# Patient Record
Sex: Male | Born: 1980 | Race: White | Hispanic: No | Marital: Single | Smoking: Current every day smoker
Health system: Southern US, Community
[De-identification: ages and names within clinical notes are randomized; demographics above are authoritative.]

---

## 2008-06-17 ENCOUNTER — Emergency Department: Payer: Self-pay | Admitting: Unknown Physician Specialty

## 2010-08-25 ENCOUNTER — Emergency Department: Payer: Self-pay | Admitting: Internal Medicine

## 2010-08-27 ENCOUNTER — Emergency Department: Payer: Self-pay | Admitting: Emergency Medicine

## 2016-01-28 ENCOUNTER — Emergency Department: Payer: Self-pay

## 2016-01-28 ENCOUNTER — Emergency Department
Admission: EM | Admit: 2016-01-28 | Discharge: 2016-01-29 | Disposition: A | Discharge status: Home / Self Care | Payer: Self-pay | Attending: Emergency Medicine | Admitting: Emergency Medicine

## 2016-01-28 DIAGNOSIS — R112 Nausea with vomiting, unspecified: Secondary | ICD-10-CM | POA: Insufficient documentation

## 2016-01-28 DIAGNOSIS — F17210 Nicotine dependence, cigarettes, uncomplicated: Secondary | ICD-10-CM | POA: Insufficient documentation

## 2016-01-28 DIAGNOSIS — R1084 Generalized abdominal pain: Secondary | ICD-10-CM | POA: Insufficient documentation

## 2016-01-28 LAB — COMPREHENSIVE METABOLIC PANEL
ALBUMIN: 6 g/dL — AB (ref 3.5–5.0)
ALK PHOS: 72 U/L (ref 38–126)
ALT: 51 U/L (ref 17–63)
AST: 49 U/L — AB (ref 15–41)
Anion gap: 17 — ABNORMAL HIGH (ref 5–15)
BILIRUBIN TOTAL: 1 mg/dL (ref 0.3–1.2)
BUN: 22 mg/dL — AB (ref 6–20)
CALCIUM: 10.6 mg/dL — AB (ref 8.9–10.3)
CO2: 19 mmol/L — ABNORMAL LOW (ref 22–32)
Chloride: 100 mmol/L — ABNORMAL LOW (ref 101–111)
Creatinine, Ser: 1.47 mg/dL — ABNORMAL HIGH (ref 0.61–1.24)
GFR calc Af Amer: >60 mL/min (ref >60)
GFR calc non Af Amer: >60 mL/min (ref >60)
GLUCOSE: 150 mg/dL — AB (ref 65–99)
POTASSIUM: 4.5 mmol/L (ref 3.5–5.1)
Sodium: 136 mmol/L (ref 135–145)
TOTAL PROTEIN: 9.4 g/dL — AB (ref 6.5–8.1)

## 2016-01-28 LAB — URINALYSIS COMPLETE WITH MICROSCOPIC (ARMC ONLY)
BACTERIA UA: NONE SEEN
Glucose, UA: NEGATIVE mg/dL
LEUKOCYTES UA: NEGATIVE
Nitrite: NEGATIVE
PH: 5 (ref 5.0–8.0)
Protein, ur: 100 mg/dL — AB
Specific Gravity, Urine: 1.04 — ABNORMAL HIGH (ref 1.005–1.030)

## 2016-01-28 LAB — CBC WITH DIFFERENTIAL/PLATELET
BASOS ABS: 0.1 10*3/uL (ref 0–0.1)
BASOS PCT: 0 %
Eosinophils Absolute: 0 10*3/uL (ref 0–0.7)
Eosinophils Relative: 0 %
HEMATOCRIT: 47.7 % (ref 40.0–52.0)
HEMOGLOBIN: 16.6 g/dL (ref 13.0–18.0)
LYMPHS ABS: 2.3 10*3/uL (ref 1.0–3.6)
LYMPHS PCT: 11 %
MCH: 30 pg (ref 26.0–34.0)
MCHC: 34.9 g/dL (ref 32.0–36.0)
MCV: 86.1 fL (ref 80.0–100.0)
MONOS PCT: 5 %
Monocytes Absolute: 1.1 10*3/uL — ABNORMAL HIGH (ref 0.2–1.0)
Neutro Abs: 17.7 10*3/uL — ABNORMAL HIGH (ref 1.4–6.5)
Neutrophils Relative %: 84 %
Platelets: 261 10*3/uL (ref 150–440)
RBC: 5.54 MIL/uL (ref 4.40–5.90)
RDW: 13.1 % (ref 11.5–14.5)
WBC: 21.2 10*3/uL — ABNORMAL HIGH (ref 3.8–10.6)

## 2016-01-28 LAB — LIPASE, BLOOD: Lipase: 16 U/L (ref 11–51)

## 2016-01-28 MED ORDER — IOPAMIDOL (ISOVUE-300) INJECTION 61%
100.0000 mL | Freq: Once | INTRAVENOUS | Status: AC | PRN
  Administered 2016-01-28: 100 mL via INTRAVENOUS

## 2016-01-28 MED ORDER — SODIUM CHLORIDE 0.9 % IV BOLUS (SEPSIS)
2000.0000 mL | Freq: Once | INTRAVENOUS | Status: AC
  Administered 2016-01-28: 1000 mL via INTRAVENOUS

## 2016-01-28 MED ORDER — ONDANSETRON HCL 4 MG/2ML IJ SOLN
4.0000 mg | Freq: Once | INTRAMUSCULAR | Status: AC
  Administered 2016-01-28: 4 mg via INTRAVENOUS
  Filled 2016-01-28: qty 2

## 2016-01-28 MED ORDER — MORPHINE SULFATE (PF) 4 MG/ML IV SOLN
4.0000 mg | Freq: Once | INTRAVENOUS | Status: AC
  Administered 2016-01-28: 4 mg via INTRAVENOUS
  Filled 2016-01-28: qty 1

## 2016-01-28 MED ORDER — DIATRIZOATE MEGLUMINE & SODIUM 66-10 % PO SOLN
15.0000 mL | Freq: Once | ORAL | Status: AC
  Administered 2016-01-28: 15 mL via ORAL

## 2016-01-28 MED ORDER — ONDANSETRON HCL 4 MG/2ML IJ SOLN
4.0000 mg | Freq: Once | INTRAMUSCULAR | Status: AC
  Administered 2016-01-29: 4 mg via INTRAVENOUS
  Filled 2016-01-28: qty 2

## 2016-01-28 MED ORDER — MORPHINE SULFATE (PF) 4 MG/ML IV SOLN
4.0000 mg | Freq: Once | INTRAVENOUS | Status: AC
  Administered 2016-01-29: 4 mg via INTRAVENOUS
  Filled 2016-01-28: qty 1

## 2016-01-28 MED ORDER — FAMOTIDINE 20 MG PO TABS: 20.0000 mg | ORAL_TABLET | Freq: Two times a day (BID) | ORAL | 1 refills | Status: AC

## 2016-01-28 NOTE — ED Provider Notes (Addendum)
[facility] Emergency Department Provider Note  ____________________________________________  Time seen: Approximately 10:07 PM  I have reviewed the triage vital signs and the nursing notes.   HISTORY  Chief Complaint Emesis   HPI Walter Soto is a 35 y.o. male who presents for evaluation of abdominal pain, nausea and vomiting. Patient reports that for the last 7 years he has a yearly episode of epigastric discomfort associated with multiple episodes of vomiting. He has never seen a doctor or a GI specialist for any of these episodes. He reports that today at 6:45 PM he started having vomiting. He has been vomiting once every 15 minutes. Initially what ever he had eaten and now has some specks of blood in it. He also reports that he has been having severe diffuse and constant abdominal pain radiating to his bilateral flanks. He reports that he has never had pain this intense before. He hasn't tried anything at home for the pain. He denies chest pain or shortness of breath, fever, chills, diarrhea, dysuria, hematuria. He is a smoker. No family history of ischemic heart disease.  History reviewed. No pertinent past medical history.  There are no active problems to display for this patient.   No past surgical history on file.  Prior to Admission medications   Not on File    Allergies Review of patient's allergies indicates no known allergies.  No family history on file.  Social History Social History  Substance Use Topics  . Smoking status: Not on file  . Smokeless tobacco: Not on file  . Alcohol use Not on file    Review of Systems  Constitutional: Negative for fever. Eyes: Negative for visual changes. ENT: Negative for sore throat. Cardiovascular: Negative for chest pain. Respiratory: Negative for shortness of breath. Gastrointestinal: + diffuse abdominal pain, vomiting. No diarrhea. Genitourinary: Negative for dysuria. Musculoskeletal:  Negative for back pain. Skin: Negative for rash. Neurological: Negative for headaches, weakness or numbness.  ____________________________________________   PHYSICAL EXAM:  VITAL SIGNS: ED Triage Vitals  Enc Vitals Group     BP 01/28/16 2140 135/82     Pulse Rate 01/28/16 2140 71     Resp --      Temp --      Temp src --      SpO2 01/28/16 2140 97 %     Weight 01/28/16 2141 192 lb (87.1 kg)     Height 01/28/16 2141 5\' 10"  (1.778 m)     Head Circumference --      Peak Flow --      Pain Score 01/28/16 2151 9     Pain Loc --      Pain Edu? --      Excl. in GC? --     Constitutional: Alert and oriented, in moderate distress due to pain. HEENT:      Head: Normocephalic and atraumatic.         Eyes: Conjunctivae are normal. Sclera is non-icteric. EOMI. PERRL      Mouth/Throat: Mucous membranes are dry.       Neck: Supple with no signs of meningismus. Cardiovascular: Regular rate and rhythm. No murmurs, gallops, or rubs. 2+ symmetrical distal pulses are present in all extremities. No JVD. Respiratory: Normal respiratory effort. Lungs are clear to auscultation bilaterally. No wheezes, crackles, or rhonchi.  Gastrointestinal: Soft, diffusely tender to palpation in all quadrants other than RLQ with guarding. No rebound Genitourinary: bilateral CVA tenderness. Musculoskeletal: Nontender with normal range of motion in  all extremities. No edema, cyanosis, or erythema of extremities. Neurologic: Normal speech and language. Face is symmetric. Moving all extremities. No gross focal neurologic deficits are appreciated. Skin: Skin is warm, dry and intact. No rash noted. Psychiatric: Mood and affect are normal. Speech and behavior are normal.  ____________________________________________   LABS (all labs ordered are listed, but only abnormal results are displayed)  Labs Reviewed  CBC WITH DIFFERENTIAL/PLATELET - Abnormal; Notable for the following:       Result Value   WBC 21.2 (*)     Neutro Abs 17.7 (*)    Monocytes Absolute 1.1 (*)    All other components within normal limits  COMPREHENSIVE METABOLIC PANEL - Abnormal; Notable for the following:    Chloride 100 (*)    CO2 19 (*)    Glucose, Bld 150 (*)    BUN 22 (*)    Creatinine, Ser 1.47 (*)    Calcium 10.6 (*)    Total Protein 9.4 (*)    Albumin 6.0 (*)    AST 49 (*)    Anion gap 17 (*)    All other components within normal limits  URINALYSIS COMPLETEWITH MICROSCOPIC ([facility] ONLY) - Abnormal; Notable for the following:    Color, Urine AMBER (*)    APPearance HAZY (*)    Bilirubin Urine 1+ (*)    Ketones, ur 2+ (*)    Specific Gravity, Urine 1.040 (*)    Hgb urine dipstick 1+ (*)    Protein, ur 100 (*)    Squamous Epithelial / LPF 0-5 (*)    All other components within normal limits  URINE CULTURE  LIPASE, BLOOD   ____________________________________________  EKG  ED ECG REPORT I, Nita Sicklearolina Iver Miklas, the attending physician, personally viewed and interpreted this ECG.  NSR, rate 55, normal intervals, normal axis, BER, no ST depressions. No prior for comparison. ____________________________________________  RADIOLOGY  CT a/p: Negative ____________________________________________   PROCEDURES  Procedure(s) performed: None Procedures Critical Care performed:  None ____________________________________________   INITIAL IMPRESSION / ASSESSMENT AND PLAN / ED COURSE  35 y.o. male who presents for evaluation of severe diffuse abdominal pain associated with nausea and multiple episodes of vomiting. Patient is in moderate distress, his vital signs are within normal limits, his abdomen is diffusely tender with guarding, he also has bilateral CVA tenderness. Patient actively vomiting. Will give IVF, IV zofran, IV pepcid, and IV morphine. Will get labs and CT scan as patient has diffuse ttp with guarding.  Clinical Course   _________________________ 11:33 PM on  01/28/2016 -----------------------------------------  Labs showing WBC of 21.1. CMP/lipase/urine negative. CT negative. Pain improved however still complaining of pain. No longer vomiting but still nauseous. Will give second round of IV morphine and zofran and re-assess for discharge. Care transferred to Dr. Dolores FrameSung  Pertinent labs & imaging results that were available during my care of the patient were reviewed by me and considered in my medical decision making (see chart for details).    ____________________________________________   FINAL CLINICAL IMPRESSION(S) / ED DIAGNOSES  Final diagnoses:  Generalized abdominal pain  Non-intractable vomiting with nausea, vomiting of unspecified type      NEW MEDICATIONS STARTED DURING THIS VISIT:  New Prescriptions   No medications on file     Note:  This document was prepared using Dragon voice recognition software and may include unintentional dictation errors.    Nita Sicklearolina Taylore Hinde, MD 01/28/16 2357    Nita Sicklearolina Huzaifa Viney, MD 01/29/16 1022

## 2016-01-28 NOTE — ED Triage Notes (Signed)
Pt in with acute onset of epigastric pain and vomiting, also co bilat flank pain. Pt has had n.v.d. And decreased urination.

## 2016-01-29 MED ORDER — PROMETHAZINE HCL 25 MG PO TABS
25.0000 mg | ORAL_TABLET | Freq: Once | ORAL | Status: AC
  Administered 2016-01-29: 25 mg via ORAL
  Filled 2016-01-29: qty 1

## 2016-01-29 MED ORDER — ONDANSETRON HCL 4 MG PO TABS: 4.0000 mg | ORAL_TABLET | Freq: Three times a day (TID) | ORAL | 1 refills | Status: AC | PRN

## 2016-01-29 NOTE — Discharge Instructions (Addendum)

## 2016-01-29 NOTE — ED Provider Notes (Signed)
-----------------------------------------   12:20 AM on 01/29/2016 -----------------------------------------  ED ECG REPORT I, Sabriel Borromeo J, the attending physician, personally viewed and interpreted this ECG.   Date: 01/29/2016  EKG Time: 0016  Rate: 55  Rhythm: sinus bradycardia  Axis: Normal  Intervals:none  ST&T Change: BER  ----------------------------------------- 1:16 AM on 01/29/2016 -----------------------------------------  Pain gone. Nausea much improved. Patient asking for additional dose of antiemetic to get him through the night. Phenergan PO ordered. Patient will be discharged home with prescriptions for Pepcid and Zofran per Dr. Don PerkingVeronese. Strict return precautions given. Patient and family member verbalize understanding and agree with plan of care.   Irean HongJade J Laine Fonner, MD 01/29/16 [phone]

## 2016-01-30 ENCOUNTER — Emergency Department: Payer: Self-pay

## 2016-01-30 ENCOUNTER — Emergency Department
Admission: EM | Admit: 2016-01-30 | Discharge: 2016-01-30 | Disposition: A | Discharge status: Home / Self Care | Payer: Self-pay | Attending: Emergency Medicine | Admitting: Emergency Medicine

## 2016-01-30 DIAGNOSIS — K219 Gastro-esophageal reflux disease without esophagitis: Secondary | ICD-10-CM | POA: Insufficient documentation

## 2016-01-30 DIAGNOSIS — F17210 Nicotine dependence, cigarettes, uncomplicated: Secondary | ICD-10-CM | POA: Insufficient documentation

## 2016-01-30 DIAGNOSIS — K2970 Gastritis, unspecified, without bleeding: Secondary | ICD-10-CM | POA: Insufficient documentation

## 2016-01-30 LAB — COMPREHENSIVE METABOLIC PANEL
ALK PHOS: 61 U/L (ref 38–126)
ALT: 44 U/L (ref 17–63)
AST: 61 U/L — AB (ref 15–41)
Albumin: 5.2 g/dL — ABNORMAL HIGH (ref 3.5–5.0)
Anion gap: 11 (ref 5–15)
BUN: 18 mg/dL (ref 6–20)
CALCIUM: 9.6 mg/dL (ref 8.9–10.3)
CO2: 24 mmol/L (ref 22–32)
CREATININE: 1.08 mg/dL (ref 0.61–1.24)
Chloride: 101 mmol/L (ref 101–111)
Glucose, Bld: 133 mg/dL — ABNORMAL HIGH (ref 65–99)
Potassium: 3.7 mmol/L (ref 3.5–5.1)
Sodium: 136 mmol/L (ref 135–145)
Total Bilirubin: 0.9 mg/dL (ref 0.3–1.2)
Total Protein: 8.4 g/dL — ABNORMAL HIGH (ref 6.5–8.1)

## 2016-01-30 LAB — URINALYSIS COMPLETE WITH MICROSCOPIC (ARMC ONLY)
BILIRUBIN URINE: NEGATIVE
Bacteria, UA: NONE SEEN
Glucose, UA: NEGATIVE mg/dL
Hgb urine dipstick: NEGATIVE
LEUKOCYTES UA: NEGATIVE
Nitrite: NEGATIVE
PROTEIN: 100 mg/dL — AB
SPECIFIC GRAVITY, URINE: 1.031 — AB (ref 1.005–1.030)
Squamous Epithelial / LPF: NONE SEEN
pH: 5 (ref 5.0–8.0)

## 2016-01-30 LAB — CBC
HCT: 46.6 % (ref 40.0–52.0)
Hemoglobin: 16.2 g/dL (ref 13.0–18.0)
MCH: 30.3 pg (ref 26.0–34.0)
MCHC: 34.8 g/dL (ref 32.0–36.0)
MCV: 87 fL (ref 80.0–100.0)
PLATELETS: 209 10*3/uL (ref 150–440)
RBC: 5.35 MIL/uL (ref 4.40–5.90)
RDW: 13.1 % (ref 11.5–14.5)
WBC: 12.7 10*3/uL — AB (ref 3.8–10.6)

## 2016-01-30 LAB — URINE CULTURE: Culture: NO GROWTH

## 2016-01-30 LAB — FIBRIN DERIVATIVES D-DIMER (ARMC ONLY): Fibrin derivatives D-dimer (ARMC): 344 (ref 0–499)

## 2016-01-30 LAB — TROPONIN I: Troponin I: <0.03 ng/mL (ref <0.03)

## 2016-01-30 LAB — LIPASE, BLOOD: Lipase: 20 U/L (ref 11–51)

## 2016-01-30 MED ORDER — ONDANSETRON HCL 4 MG/2ML IJ SOLN
4.0000 mg | Freq: Once | INTRAMUSCULAR | Status: AC | PRN
  Administered 2016-01-30: 4 mg via INTRAVENOUS
  Filled 2016-01-30: qty 2

## 2016-01-30 MED ORDER — GI COCKTAIL ~~LOC~~
30.0000 mL | Freq: Once | ORAL | Status: AC
  Administered 2016-01-30: 30 mL via ORAL
  Filled 2016-01-30: qty 30

## 2016-01-30 MED ORDER — FAMOTIDINE IN NACL 20-0.9 MG/50ML-% IV SOLN
20.0000 mg | Freq: Two times a day (BID) | INTRAVENOUS | Status: DC
  Administered 2016-01-30: 20 mg via INTRAVENOUS
  Filled 2016-01-30: qty 50

## 2016-01-30 MED ORDER — HYDROMORPHONE HCL 1 MG/ML IJ SOLN
1.0000 mg | Freq: Once | INTRAMUSCULAR | Status: AC
  Administered 2016-01-30: 1 mg via INTRAVENOUS
  Filled 2016-01-30: qty 1

## 2016-01-30 MED ORDER — SODIUM CHLORIDE 0.9 % IV BOLUS (SEPSIS)
1000.0000 mL | Freq: Once | INTRAVENOUS | Status: AC
  Administered 2016-01-30: 1000 mL via INTRAVENOUS

## 2016-01-30 NOTE — ED Provider Notes (Signed)
[facility] Emergency Department Provider Note   ____________________________________________   First MD Initiated Contact with Patient 01/30/16 0700     (approximate)  I have reviewed the triage vital signs and the nursing notes.   HISTORY  Chief Complaint Emesis and Abdominal Pain    HPI Walter Soto is a 35 y.o. male with a history of gastritis who is presenting to the emergency department with epigastric abdominal pain and vomiting. He said the pain was sudden onset at 4:30 this morning and feels like a burning cramping pain. He says it goes up into his chest. He says he has vomited once already this morning. Denies any blood in his vomit or green vomit this morning. Does not report any diarrhea. Says that he has been taking Nexium at home without relief. Says that he has had morphine in the past as well as Dilaudid for his pain control.Patient denies any drinking or drug use. He says that the pain is worse when he is lying down. Says the pain was 7-8 yesterday but today is more of a 9-10.    History reviewed. No pertinent past medical history.  There are no active problems to display for this patient.   History reviewed. No pertinent surgical history.  Prior to Admission medications   Medication Sig Start Date End Date Taking? Authorizing Provider  Aspirin-Acetaminophen-Caffeine (GOODY HEADACHE PO) Take 1 packet by mouth daily as needed. For pain and headache.   Yes Historical Provider, MD  esomeprazole (NEXIUM) 20 MG capsule Take 20 mg by mouth daily at 12 noon.   Yes Historical Provider, MD  famotidine (PEPCID) 20 MG tablet Take 1 tablet (20 mg total) by mouth 2 (two) times daily. 01/28/16 01/27/17 Yes Nita Sickle, MD  ondansetron (ZOFRAN) 4 MG tablet Take 1 tablet (4 mg total) by mouth every 8 (eight) hours as needed for nausea or vomiting. 01/29/16 01/28/17 Yes Nita Sickle, MD    Allergies Penicillins  No family history on  file.  Social History Social History  Substance Use Topics  . Smoking status: Current Every Day Smoker    Packs/day: 0.50    Types: Cigarettes  . Smokeless tobacco: Never Used  . Alcohol use No    Review of Systems Constitutional: No fever/chills Eyes: No visual changes. ENT: No sore throat. Cardiovascular: As above Respiratory: Denies shortness of breath. Gastrointestinal:   No diarrhea.  No constipation. Genitourinary: Negative for dysuria. Musculoskeletal: Negative for back pain. Skin: Negative for rash. Neurological: Negative for headaches, focal weakness or numbness.  10-point ROS otherwise negative.  ____________________________________________   PHYSICAL EXAM:  VITAL SIGNS: ED Triage Vitals  Enc Vitals Group     BP 01/30/16 0700 (!) 144/101     Pulse Rate 01/30/16 0700 64     Resp 01/30/16 0700 (!) 23     Temp 01/30/16 0700 97.7 F (36.5 C)     Temp Source 01/30/16 0700 Oral     SpO2 01/30/16 0700 97 %     Weight 01/30/16 0702 192 lb (87.1 kg)     Height 01/30/16 0702 5\' 10"  (1.778 m)     Head Circumference --      Peak Flow --      Pain Score 01/30/16 0702 6     Pain Loc --      Pain Edu? --      Excl. in GC? --    Constitutional: Alert and oriented. Appears uncomfortable. Eyes: Conjunctivae are normal. PERRL. EOMI. Head: Atraumatic.  Nose: No congestion/rhinnorhea. Mouth/Throat: Mucous membranes are moist.  Neck: No stridor.   Cardiovascular: Normal rate, regular rhythm. Grossly normal heart sounds.   Respiratory: Normal respiratory effort.  No retractions. Lungs CTAB. Gastrointestinal: Soft with mild-to-moderate epigastric tenderness. Murphy sign.  No distention. No CVA tenderness. Musculoskeletal: No lower extremity tenderness nor edema.  No joint effusions. Neurologic:  Normal speech and language. No gross focal neurologic deficits are appreciated.  Skin:  Skin is warm, dry and intact. No rash noted. Psychiatric: Mood and affect are normal.  Speech and behavior are normal.  ____________________________________________   LABS (all labs ordered are listed, but only abnormal results are displayed)  Labs Reviewed  COMPREHENSIVE METABOLIC PANEL - Abnormal; Notable for the following:       Result Value   Glucose, Bld 133 (*)    Total Protein 8.4 (*)    Albumin 5.2 (*)    AST 61 (*)    All other components within normal limits  CBC - Abnormal; Notable for the following:    WBC 12.7 (*)    All other components within normal limits  URINALYSIS COMPLETEWITH MICROSCOPIC ([facility] ONLY) - Abnormal; Notable for the following:    Color, Urine AMBER (*)    APPearance CLEAR (*)    Ketones, ur TRACE (*)    Specific Gravity, Urine 1.031 (*)    Protein, ur 100 (*)    All other components within normal limits  LIPASE, BLOOD  FIBRIN DERIVATIVES D-DIMER ([facility] ONLY)  TROPONIN I   ____________________________________________  EKG  ED ECG REPORT I, Arelia Longest, the attending physician, personally viewed and interpreted this ECG.   Date: 01/30/2016  EKG Time: 835  Rate: 56  Rhythm: normal sinus rhythm  Axis: Normal  Intervals:none  ST&T Change: Diffuse ST elevation consistent with benign early repolarization. No abnormal T-wave inversion.  ____________________________________________  RADIOLOGY  DG Chest 1 View (Accession [id number]) (Order [id number])  Imaging  Date: 01/30/2016 Department: [facility] EMERGENCY DEPARTMENT Released By/Authorizing: Myrna Blazer, MD (auto-released)  PACS Images   Show images for DG Chest 1 View  Study Result   CLINICAL DATA:  Nausea and vomiting with abdomen pain for several days.  EXAM: CHEST 1 VIEW  COMPARISON:  August 27, 2010  FINDINGS: The heart size and mediastinal contours are within normal limits. There is no focal infiltrate, pulmonary edema, or pleural effusion. The visualized skeletal structures are unremarkable.  IMPRESSION: No  active cardiopulmonary disease.   Electronically Signed   By: Sherian Rein M.D.   On: 01/30/2016 08:52     ____________________________________________   PROCEDURES  Procedure(s) performed:   Procedures  Critical Care performed:   ____________________________________________   INITIAL IMPRESSION / ASSESSMENT AND PLAN / ED COURSE  Pertinent labs & imaging results that were available during my care of the patient were reviewed by me and considered in my medical decision making (see chart for details).  Patient with recent visit with CAT scan that was negative for any acute pathology.  Clinical Course    ----------------------------------------- 11:06 AM on 01/30/2016 -----------------------------------------  Patient with minimal relief after GI cocktail. After 1 mg of Dilaudid the patient says he "feels awesome."  He has no abdominal tenderness at this time. Very reassuring lab workup with a decreasing white blood cell count over the past several days. Reassuring x-ray without any acute pathology. Patient will continue with his Prilosec. I told him that I would like to add sucralfate but he said that he  had increased nausea and vomiting with this several years ago. Because of this and told him that he may take Maalox over-the-counter for breakthrough pain. He will also be following up with gastroenterology. ____________________________________________   FINAL CLINICAL IMPRESSION(S) / ED DIAGNOSES  Gastritis. GERD.    NEW MEDICATIONS STARTED DURING THIS VISIT:  New Prescriptions   No medications on file     Note:  This document was prepared using Dragon voice recognition software and may include unintentional dictation errors.    Myrna Blazeravid Matthew Schaevitz, MD 01/30/16 [id number]

## 2016-01-30 NOTE — ED Notes (Signed)
Pt states he felt the burning sensation disappear after receiving the GI cocktail, but states it hurts to breathe.  Informed Dr.Schaevitz of patient's response to med.  Also noticed patient has periods of bradycardia in the low 50's. Captured a second EKG to show MD.  No new orders at this time.  Pt uncomfortable sitting in any position at this time. CXR results are pending at this time. Pt has had minimal dry heaving since receiving zofran earlier.

## 2016-01-30 NOTE — ED Triage Notes (Signed)
Per EMS: Pt c/o N/V abdominal pain. Pt was [facility] ED the 2 times in the last 2 days for same. Pt has not filled prescribed medications due to inability to pay for medications.

## 2017-11-25 IMAGING — CT CT ABD-PELV W/ CM
2 of 4 series · 16 of 46 positions shown, 18 images · IV contrast (iopamidol)
Comparison: 08/25/2010

CLINICAL DATA: Diffuse abdominal pain with guarding.

EXAM:
CT ABDOMEN AND PELVIS WITH CONTRAST
TECHNIQUE: Multidetector CT imaging of the abdomen and pelvis was performed
using the standard protocol following bolus administration of
intravenous contrast.
CONTRAST:  100mL 5T1KZP-JYY IOPAMIDOL (5T1KZP-JYY) INJECTION 61%

[Series 2: axial st · axial · 0.82mm/px · z∈[-767,-292]mm · 13 of 105 slices shown, 15 images]
[im 5/105  soft-tissue]
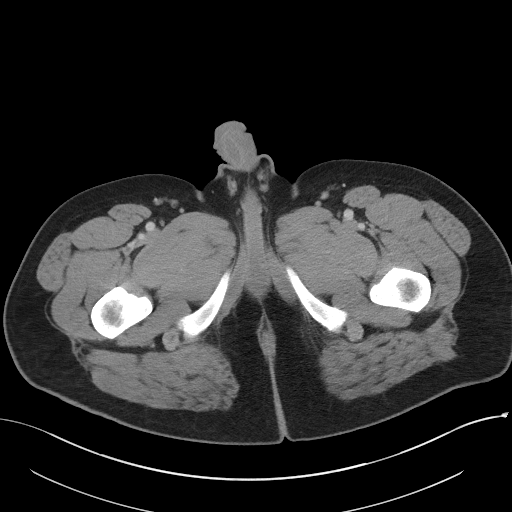
[im 5/105  bone]
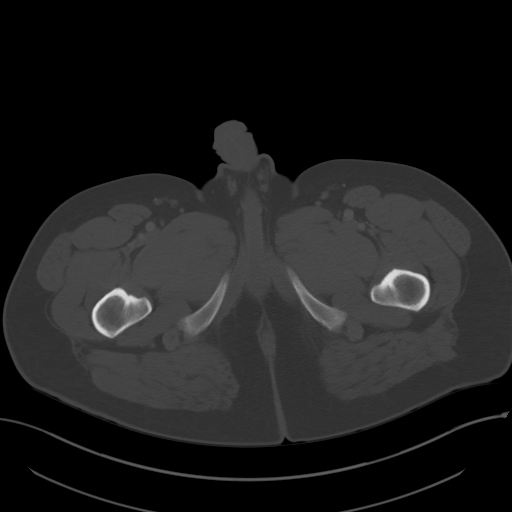
[im 14/105  soft-tissue]
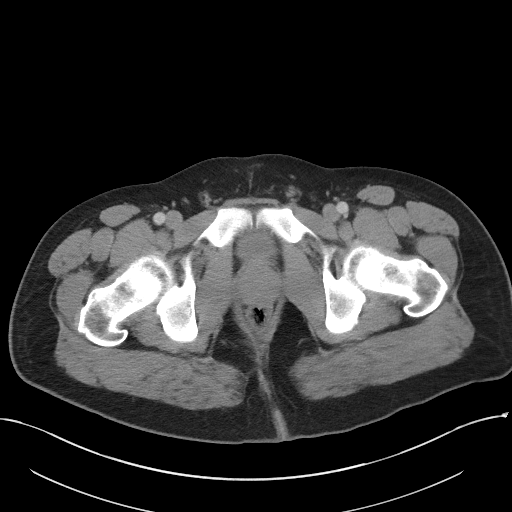
[im 22/105  soft-tissue]
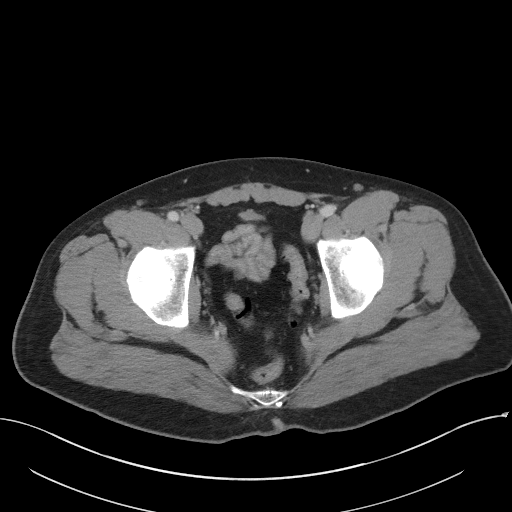
[im 31/105  soft-tissue]
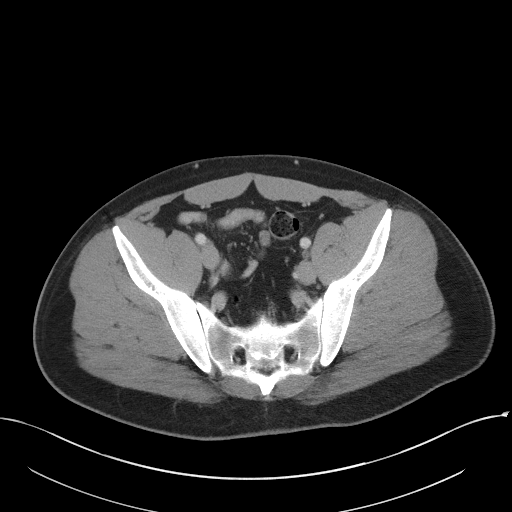
[im 35/105  soft-tissue]
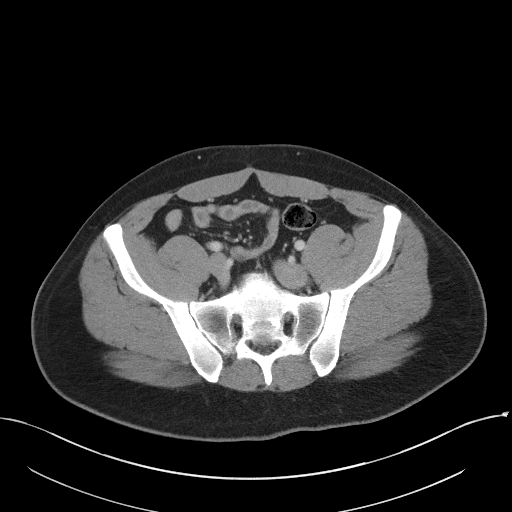
[im 44/105  soft-tissue]
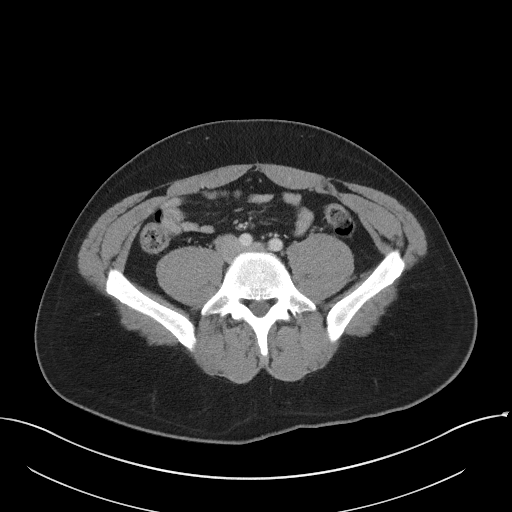
[im 53/105  soft-tissue]
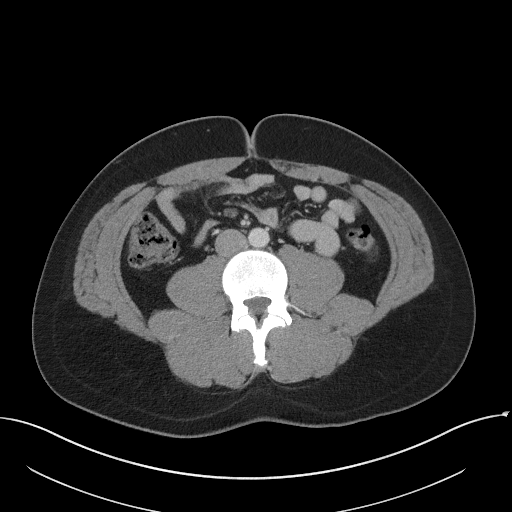
[im 61/105  soft-tissue]
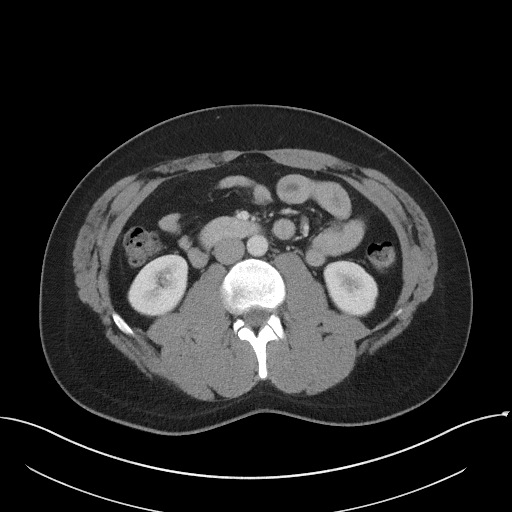
[im 70/105  soft-tissue]
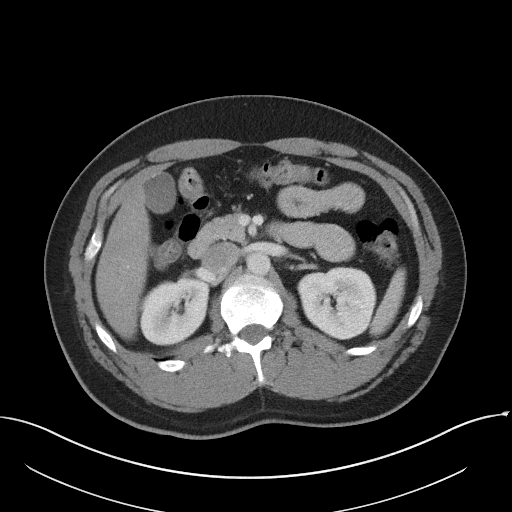
[im 70/105  bone]
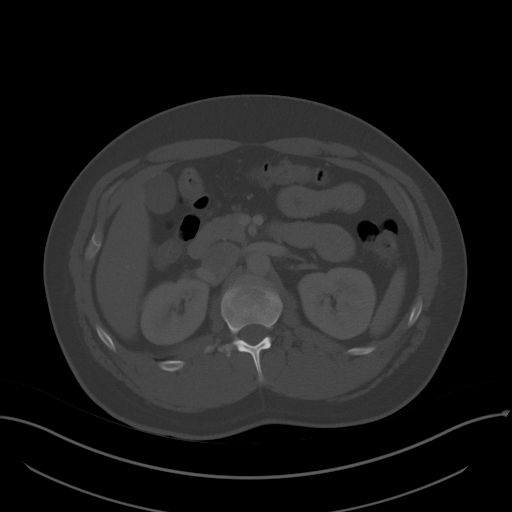
[im 74/105  soft-tissue]
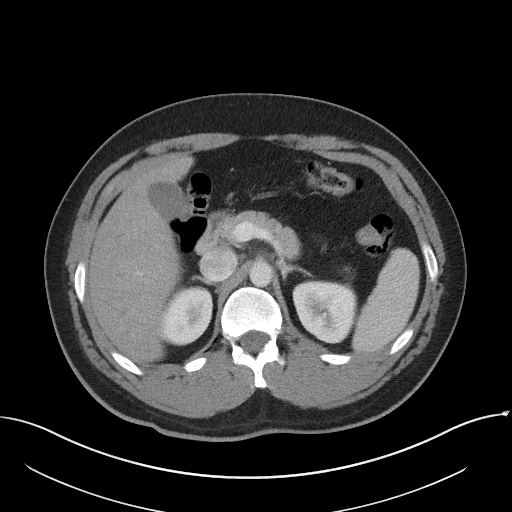
[im 83/105  soft-tissue]
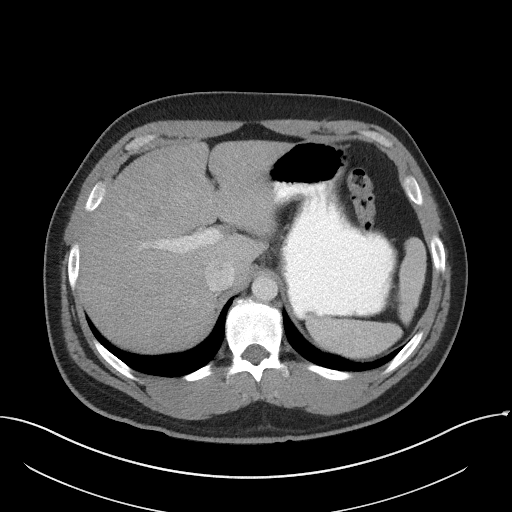
[im 92/105  soft-tissue]
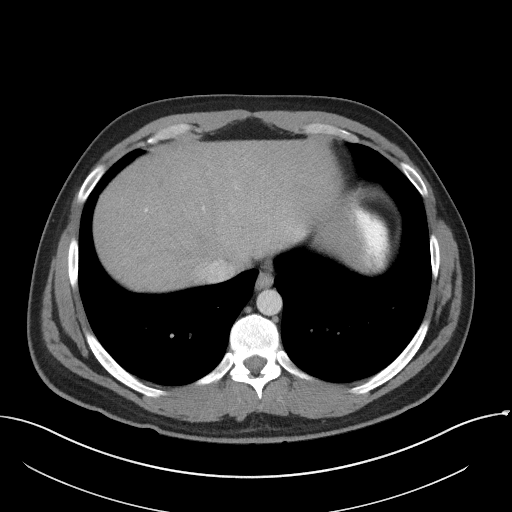
[im 100/105  soft-tissue]
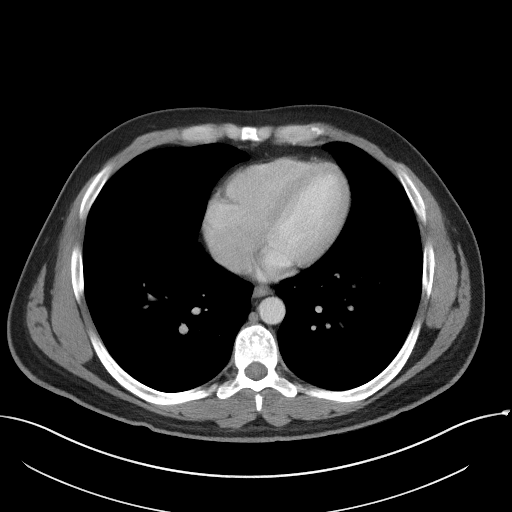

[Series 5: coronal st · coronal · 0.71mm/px · 3 of 91 slices shown]
[im 31/91  soft-tissue]
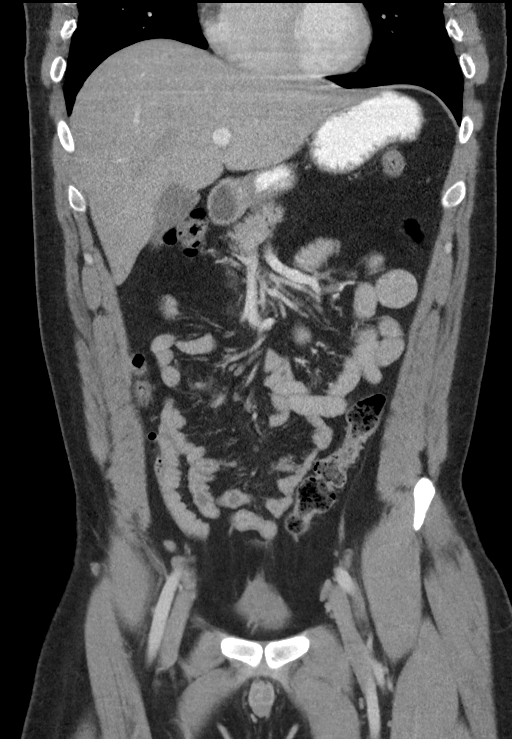
[im 41/91  soft-tissue]
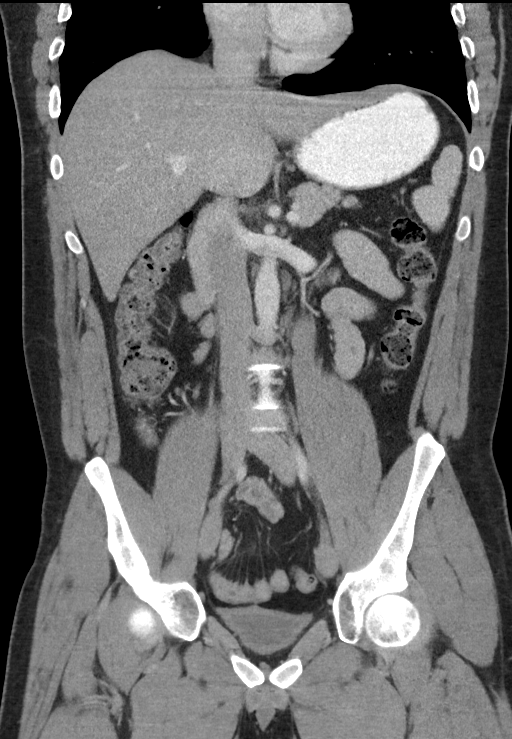
[im 51/91  soft-tissue]
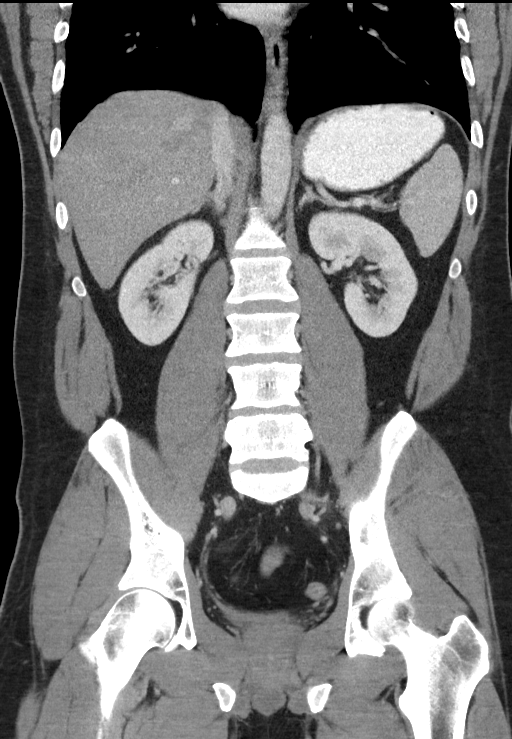

[16 of 46 positions shown; findings below may reference images not displayed]

FINDINGS: Lower chest and abdominal wall:  Negative.

Hepatobiliary: No focal liver abnormality.No evidence of biliary
obstruction or stone.

Pancreas: Unremarkable.

Spleen: Unremarkable.

Adrenals/Urinary Tract: Negative adrenals. No hydronephrosis or
stone. Unremarkable bladder.

Stomach/Bowel:  No obstruction. No appendicitis.

Reproductive:No pathologic findings.

Vascular/Lymphatic: No acute vascular abnormality. No mass or
adenopathy.

Other: No ascites or pneumoperitoneum.

Musculoskeletal: Negative.
IMPRESSION: Negative exam.  No explanation for abdominal pain.

## 2017-11-27 IMAGING — DX DG CHEST 1V
1 series · 1 of 1 positions shown · non-contrast
Comparison: August 27, 2010

CLINICAL DATA: Nausea and vomiting with abdomen pain for several
days.

EXAM:
CHEST 1 VIEW

[chest ap]
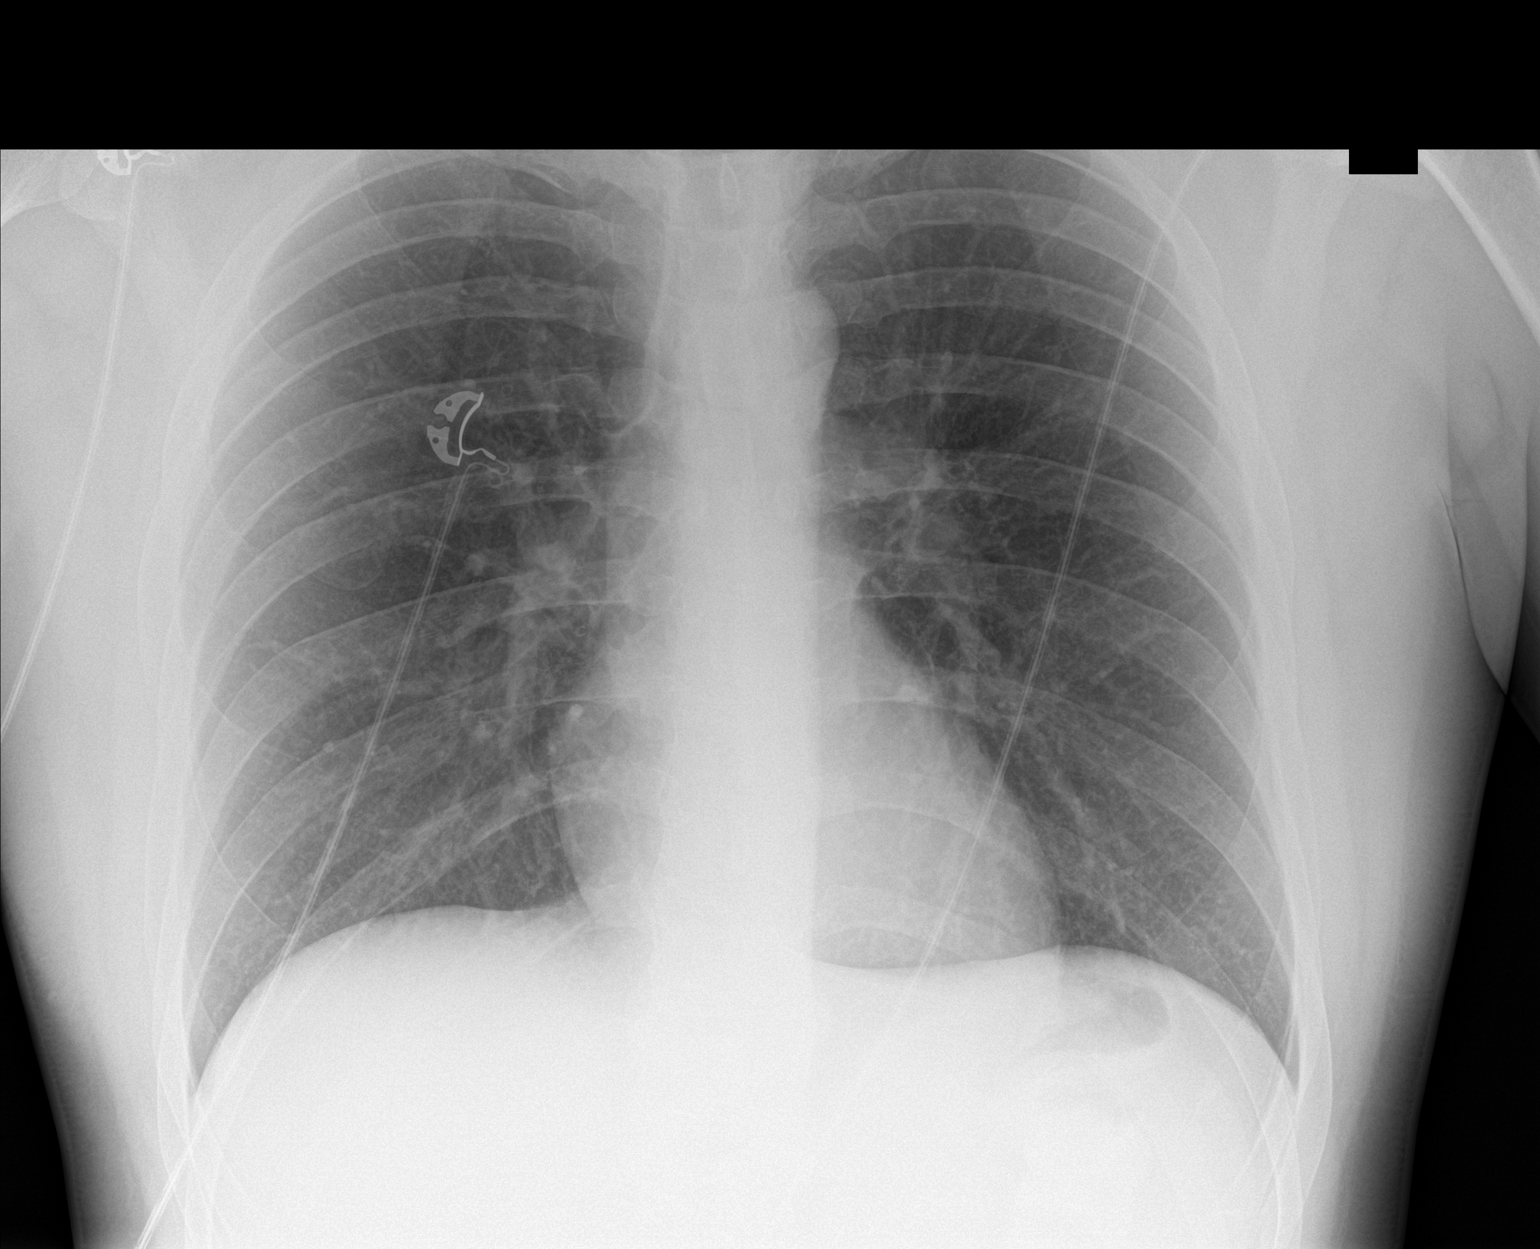

[1 of 1 positions shown; findings below may reference images not displayed]

FINDINGS: The heart size and mediastinal contours are within normal limits.
There is no focal infiltrate, pulmonary edema, or pleural effusion.
The visualized skeletal structures are unremarkable.
IMPRESSION: No active cardiopulmonary disease.

## 2018-02-17 ENCOUNTER — Other Ambulatory Visit: Payer: Self-pay

## 2018-02-17 ENCOUNTER — Emergency Department
Admission: EM | Admit: 2018-02-17 | Discharge: 2018-02-17 | Disposition: A | Discharge status: Home / Self Care | Payer: Self-pay | Attending: Emergency Medicine | Admitting: Emergency Medicine

## 2018-02-17 ENCOUNTER — Emergency Department: Payer: Self-pay

## 2018-02-17 ENCOUNTER — Encounter: Payer: Self-pay | Admitting: Emergency Medicine

## 2018-02-17 DIAGNOSIS — F17210 Nicotine dependence, cigarettes, uncomplicated: Secondary | ICD-10-CM | POA: Insufficient documentation

## 2018-02-17 DIAGNOSIS — Z79899 Other long term (current) drug therapy: Secondary | ICD-10-CM | POA: Insufficient documentation

## 2018-02-17 DIAGNOSIS — K2970 Gastritis, unspecified, without bleeding: Secondary | ICD-10-CM | POA: Insufficient documentation

## 2018-02-17 DIAGNOSIS — F1290 Cannabis use, unspecified, uncomplicated: Secondary | ICD-10-CM

## 2018-02-17 DIAGNOSIS — F12988 Cannabis use, unspecified with other cannabis-induced disorder: Secondary | ICD-10-CM | POA: Insufficient documentation

## 2018-02-17 LAB — CBC
HCT: 45.3 % (ref 40.0–52.0)
Hemoglobin: 15.9 g/dL (ref 13.0–18.0)
MCH: 31.1 pg (ref 26.0–34.0)
MCHC: 35.1 g/dL (ref 32.0–36.0)
MCV: 88.5 fL (ref 80.0–100.0)
PLATELETS: 256 10*3/uL (ref 150–440)
RBC: 5.12 MIL/uL (ref 4.40–5.90)
RDW: 13.5 % (ref 11.5–14.5)
WBC: 9.7 10*3/uL (ref 3.8–10.6)

## 2018-02-17 LAB — COMPREHENSIVE METABOLIC PANEL
ALT: 41 U/L (ref 0–44)
AST: 44 U/L — AB (ref 15–41)
Albumin: 5.4 g/dL — ABNORMAL HIGH (ref 3.5–5.0)
Alkaline Phosphatase: 56 U/L (ref 38–126)
Anion gap: 11 (ref 5–15)
BUN: 22 mg/dL — AB (ref 6–20)
CO2: 26 mmol/L (ref 22–32)
Calcium: 10.1 mg/dL (ref 8.9–10.3)
Chloride: 102 mmol/L (ref 98–111)
Creatinine, Ser: 1.13 mg/dL (ref 0.61–1.24)
GFR calc Af Amer: >60 mL/min (ref >60)
Glucose, Bld: 128 mg/dL — ABNORMAL HIGH (ref 70–99)
Potassium: 4 mmol/L (ref 3.5–5.1)
Sodium: 139 mmol/L (ref 135–145)
TOTAL PROTEIN: 8.5 g/dL — AB (ref 6.5–8.1)
Total Bilirubin: 0.6 mg/dL (ref 0.3–1.2)

## 2018-02-17 LAB — URINALYSIS, COMPLETE (UACMP) WITH MICROSCOPIC
Bacteria, UA: NONE SEEN
Bilirubin Urine: NEGATIVE
Glucose, UA: NEGATIVE mg/dL
HGB URINE DIPSTICK: NEGATIVE
Ketones, ur: NEGATIVE mg/dL
LEUKOCYTES UA: NEGATIVE
Nitrite: NEGATIVE
PH: 7 (ref 5.0–8.0)
Protein, ur: 30 mg/dL — AB
SPECIFIC GRAVITY, URINE: 1.029 (ref 1.005–1.030)
SQUAMOUS EPITHELIAL / LPF: NONE SEEN (ref 0–5)
WBC, UA: NONE SEEN WBC/hpf (ref 0–5)

## 2018-02-17 LAB — TROPONIN I

## 2018-02-17 LAB — LIPASE, BLOOD: Lipase: 34 U/L (ref 11–51)

## 2018-02-17 MED ORDER — HYDROMORPHONE HCL 1 MG/ML IJ SOLN
INTRAMUSCULAR | Status: AC
  Filled 2018-02-17: qty 1

## 2018-02-17 MED ORDER — MORPHINE SULFATE (PF) 2 MG/ML IV SOLN
2.0000 mg | Freq: Once | INTRAVENOUS | Status: AC
  Administered 2018-02-17: 2 mg via INTRAVENOUS
  Filled 2018-02-17: qty 1

## 2018-02-17 MED ORDER — HALOPERIDOL LACTATE 5 MG/ML IJ SOLN
2.0000 mg | Freq: Once | INTRAMUSCULAR | Status: AC
  Administered 2018-02-17: 2 mg via INTRAVENOUS
  Filled 2018-02-17: qty 1

## 2018-02-17 MED ORDER — GI COCKTAIL ~~LOC~~
30.0000 mL | Freq: Once | ORAL | Status: AC
  Administered 2018-02-17: 30 mL via ORAL
  Filled 2018-02-17: qty 30

## 2018-02-17 MED ORDER — PROMETHAZINE HCL 25 MG RE SUPP: 25.0000 mg | Freq: Three times a day (TID) | RECTAL | 0 refills | Status: AC | PRN

## 2018-02-17 MED ORDER — HYDROMORPHONE HCL 1 MG/ML IJ SOLN
1.0000 mg | INTRAMUSCULAR | Status: AC
  Administered 2018-02-17: 1 mg via INTRAVENOUS

## 2018-02-17 NOTE — ED Notes (Signed)
[place] to [place], RCharity fundraiser

## 2018-02-17 NOTE — ED Notes (Signed)
Patient transported to X-ray 

## 2018-02-17 NOTE — Discharge Instructions (Addendum)
Your exam and evaluation are overall reassuring in the emerge department today.  Return to the emergency department immediately for any worsening condition including new or worsening or uncontrolled pain, vomiting blood, black or bloody stools, sent for dehydration such as not making urine, dizziness or passing out, or any other symptoms concerning to you.

## 2018-02-17 NOTE — ED Provider Notes (Signed)
[facility] Emergency Department Provider Note ____________________________________________   I have reviewed the triage vital signs and the triage nursing note.  HISTORY  Chief Complaint Abdominal Pain and Emesis   Historian Patient  HPI Walter Soto is a 37 y.o. male with a history of marijuana abuse, coming today for nausea vomiting and epigastric pain on and off for several days now.  He has seen [facility] for this and states that Phenergan works but Zofran has not worked at home.  States he feels like he has stomach acid that is causing burning.  No black or bloody stools.  No bloody vomiting.  Numerous, in episodes of emesis.  Pain is moderate to severe in the epigastrium.  No coughing or fevers.   History reviewed. No pertinent past medical history.  There are no active problems to display for this patient.   History reviewed. No pertinent surgical history.  Prior to Admission medications   Medication Sig Start Date End Date Taking? Authorizing Provider  Aspirin-Acetaminophen-Caffeine (GOODY HEADACHE PO) Take 1 packet by mouth daily as needed. For pain and headache.    [provider]  esomeprazole (NEXIUM) 20 MG capsule Take 20 mg by mouth daily at 12 noon.    [provider]  famotidine (PEPCID) 20 MG tablet Take 1 tablet (20 mg total) by mouth 2 (two) times daily. 01/28/16 01/27/17  Nita Sickle, MD  promethazine (PHENERGAN) 25 MG suppository Place 1 suppository (25 mg total) rectally every 8 (eight) hours as needed for nausea. 02/17/18 02/17/19  Governor Rooks, MD    Allergies  Allergen Reactions  . Penicillins Other (See Comments)    Childhood allergy- Unknown reaction  Has patient had a PCN reaction causing immediate rash, facial/tongue/throat swelling, SOB or lightheadedness with hypotension: unknown Has patient had a PCN reaction causing severe rash involving mucus membranes or skin necrosis: unknown Has patient had a PCN  reaction that required hospitalization unknown Has patient had a PCN reaction occurring within the last 10 years: No If all of the above answers are "NO", then may proceed with Cephalosporin use.    History reviewed. No pertinent family history.  Social History Social History   Tobacco Use  . Smoking status: Current Every Day Smoker    Packs/day: 0.50    Types: Cigarettes  . Smokeless tobacco: Never Used  Substance Use Topics  . Alcohol use: No  . Drug use: Not on file    Review of Systems  Constitutional: Negative for fever. Eyes: Negative for visual changes. ENT: Negative for sore throat. Cardiovascular: Negative for chest pain. Respiratory: Negative for shortness of breath. Gastrointestinal: Positive as per HPI R Genitourinary: Negative for dysuria. Musculoskeletal: Negative for back pain. Skin: Negative for rash. Neurological: Negative for headache.  ____________________________________________   PHYSICAL EXAM:  VITAL SIGNS: ED Triage Vitals [02/17/18 0920]  Enc Vitals Group     BP (!) 163/86     Pulse Rate 72     Resp 18     Temp 97.9 F (36.6 C)     Temp Source Oral     SpO2 99 %     Weight 200 lb (90.7 kg)     Height 6' (1.829 m)     Head Circumference      Peak Flow      Pain Score 9     Pain Loc      Pain Edu?      Excl. in GC?      Constitutional: Alert and  oriented.  Active heaving. HEENT      Head: Normocephalic and atraumatic.      Eyes: Conjunctivae are normal. Pupils equal and round.       Ears:         Nose: No congestion/rhinnorhea.      Mouth/Throat: Mucous membranes are mildly dry.      Neck: No stridor. Cardiovascular/Chest: Normal rate, regular rhythm.  No murmurs, rubs, or gallops. Respiratory: Normal respiratory effort without tachypnea nor retractions. Breath sounds are clear and equal bilaterally. No wheezes/rales/rhonchi. Gastrointestinal: Soft. No distention, no guarding, no rebound.  Gastric tenderness palpation.  No  focal right upper quadrant tenderness.  No right lower abdominal tenderness. Genitourinary/rectal:Deferred Musculoskeletal: Nontender with normal range of motion in all extremities. No joint effusions.  No lower extremity tenderness.  No edema. Neurologic:  Normal speech and language. No gross or focal neurologic deficits are appreciated. Skin:  Skin is warm, somewhat diaphoretic.  No rash noted. Psychiatric: Mood and affect are normal. Speech and behavior are normal. Patient exhibits appropriate insight and judgment.   ____________________________________________  LABS (pertinent positives/negatives) I, Governor Rooksebecca Darryn Kydd, MD the attending physician have reviewed the labs noted below.  Labs Reviewed  COMPREHENSIVE METABOLIC PANEL - Abnormal; Notable for the following components:      Result Value   Glucose, Bld 128 (*)    BUN 22 (*)    Total Protein 8.5 (*)    Albumin 5.4 (*)    AST 44 (*)    All other components within normal limits  URINALYSIS, COMPLETE (UACMP) WITH MICROSCOPIC - Abnormal; Notable for the following components:   Color, Urine AMBER (*)    APPearance TURBID (*)    Protein, ur 30 (*)    All other components within normal limits  LIPASE, BLOOD  CBC  TROPONIN I    ____________________________________________    EKG I, Governor Rooksebecca Ellee Wawrzyniak, MD, the attending physician have personally viewed and interpreted all ECGs.  61 bpm.  normal sinus rhythm.  Narrow QS P normal axis.  Normal ST and T wave ____________________________________________  RADIOLOGY   Chest x-ray of the abdomen x-rays viewed by me, I reviewed the radiologist interpretation: No emergency findings __________________________________________  PROCEDURES  Procedure(s) performed: None  Procedures  Critical Care performed: None   ____________________________________________  ED COURSE / ASSESSMENT AND PLAN  Pertinent labs & imaging results that were available during my care of the patient were  reviewed by me and considered in my medical decision making (see chart for details).    Patient here with active emesis which he states has been for really a couple days now as well as epigastric discomfort.  I am most suspicious at this point for marijuana hyperemesis given without significant BM symptoms, seems less likely to be gastroneuritis.  Laboratory studies are reassuring in terms of no evidence for pancreatitis or biliary etiology clinically or on laboratory studies.  Patient does look quite uncomfortable and was initially treated with with pain medication as well as IV Haldol to help with marijuana hyperemesis.  Patient continued to have significant pain which I think is probably gastritis related, I did give him an additional dose of IV medication and will try GI cocktail as well.  Reevaluation, patient looks much better, we discussed reassuring laboratory studies are not concerned at this point about intra-abdominal emergency.  Not suspicious of a cardiac emergency.  We discussed that my suspicion is cannabis hyperemesis.  He seemed receptive to this.  I am going to discharge home  with PR Phenergan and he is going to continue to treat him medically for gastritis.    CONSULTATIONS: None   Patient / Family / Caregiver informed of clinical course, medical decision-making process, and agree with plan.   I discussed return precautions, follow-up instructions, and discharge instructions with patient and/or family.  Discharge Instructions : Your exam and evaluation are overall reassuring in the emerge department today.  Return to the emergency department immediately for any worsening condition including new or worsening or uncontrolled pain, vomiting blood, black or bloody stools, sent for dehydration such as not making urine, dizziness or passing out, or any other symptoms concerning to you.    ___________________________________________   FINAL CLINICAL IMPRESSION(S) / ED  DIAGNOSES   Final diagnoses:  Cannabinoid hyperemesis syndrome (HCC)  Gastritis without bleeding, unspecified chronicity, unspecified gastritis type      ___________________________________________         Note: This dictation was prepared with Dragon dictation. Any transcriptional errors that result from this process are unintentional    Governor Rooks, MD 02/17/18 1322

## 2018-02-17 NOTE — ED Triage Notes (Addendum)
Has had nausea and vomiting for 3 days. Unable to keep anything down. Generalized abdominal pain. [facility] initial day of sx and given zofran and sent home. Hx gastritis. Pt states "I am tired of just being sent home".  denies diarrhea. No fevers.  Pt is pale

## 2019-12-16 IMAGING — CR DG ABDOMEN ACUTE W/ 1V CHEST
1 series · 4 of 4 positions shown · non-contrast
Comparison: Chest radiographs dated 01/30/2016

CLINICAL DATA: Vomiting, abdominal pain

EXAM:
DG ABDOMEN ACUTE W/ 1V CHEST

[Series 1: dg abd acute w/chest · 0.14mm/px · 4 of 4 slices shown]
[im 1/4]
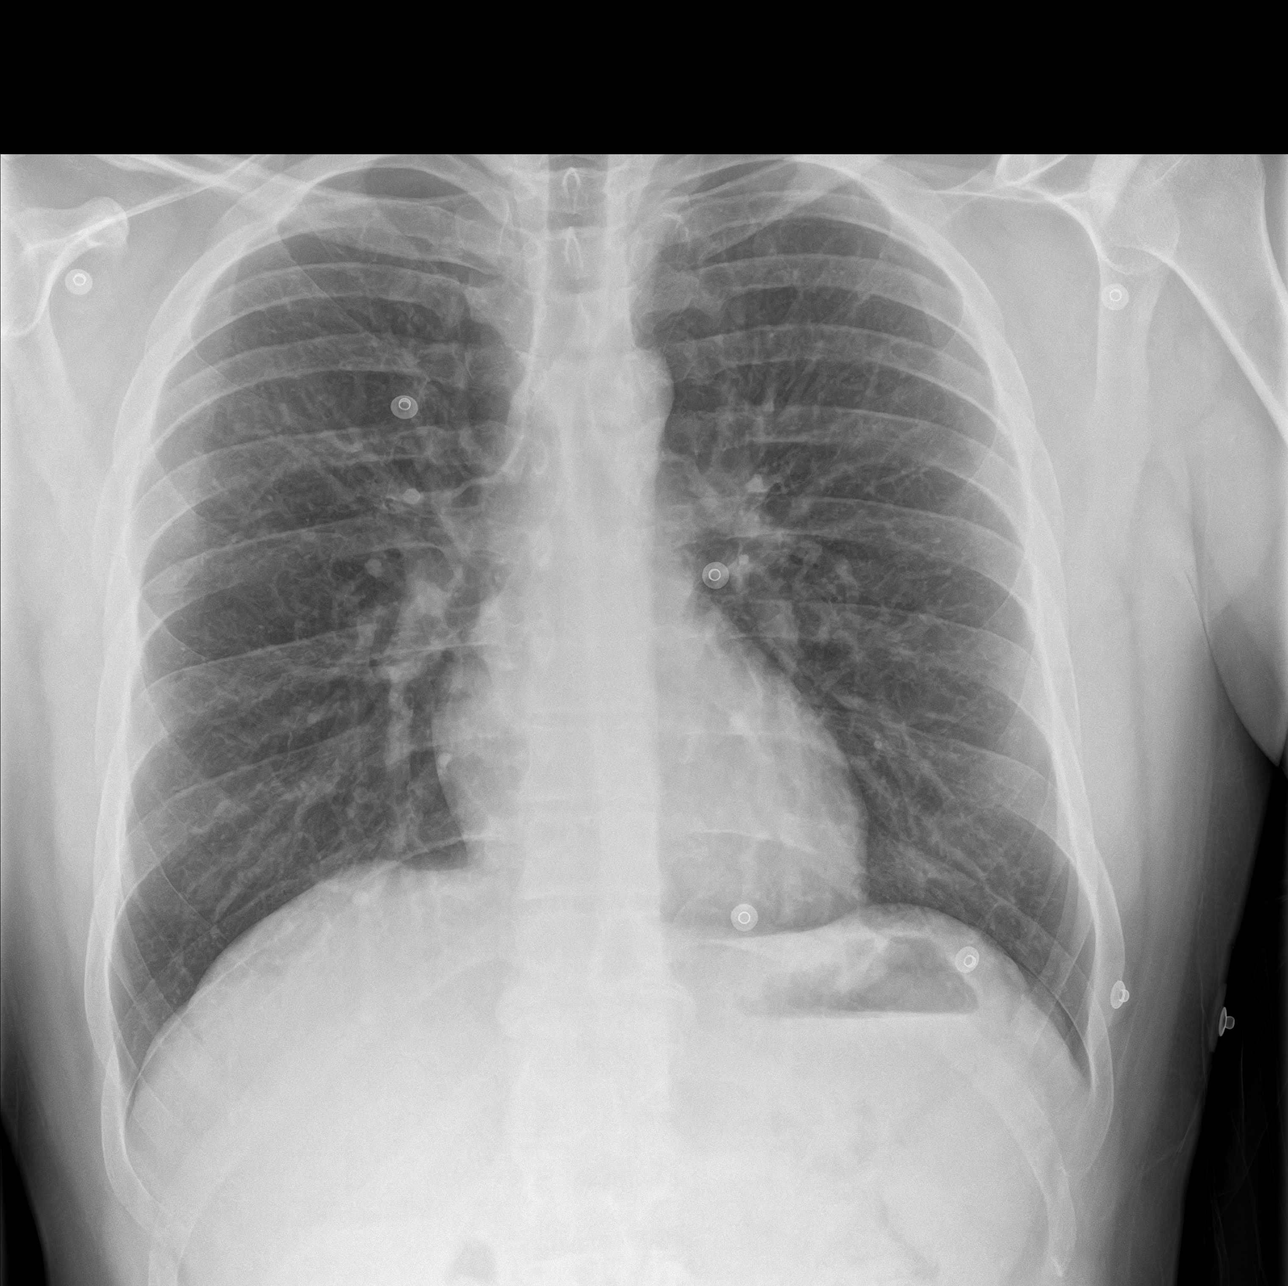
[im 2/4]
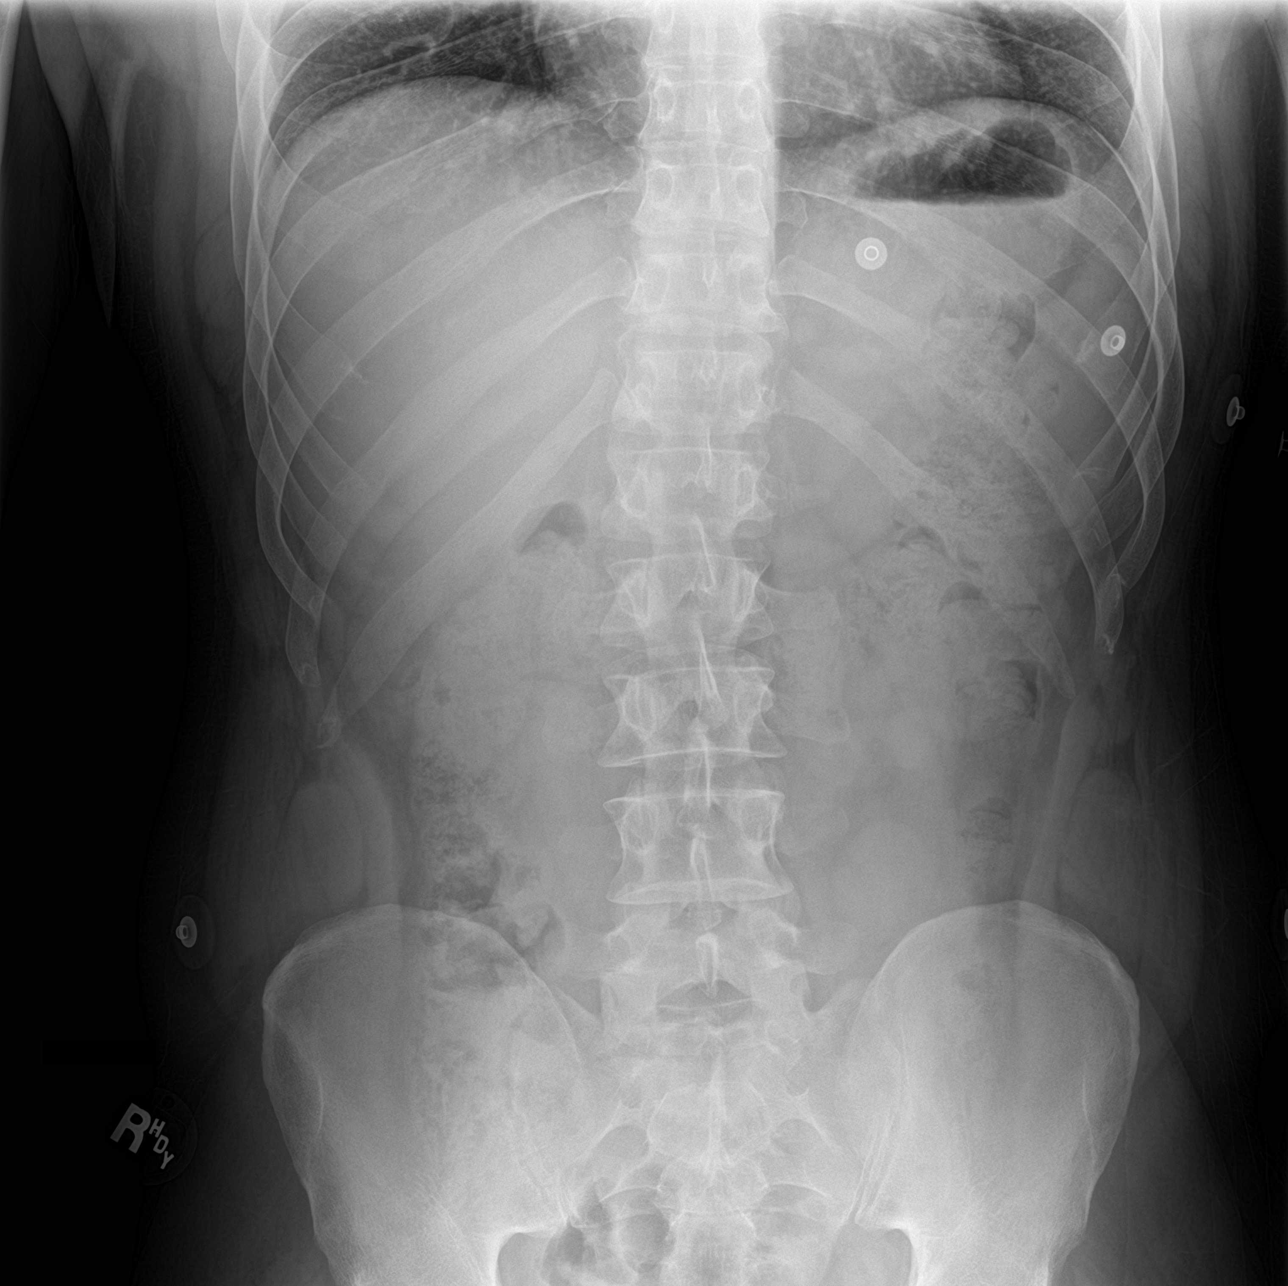
[im 3/4]
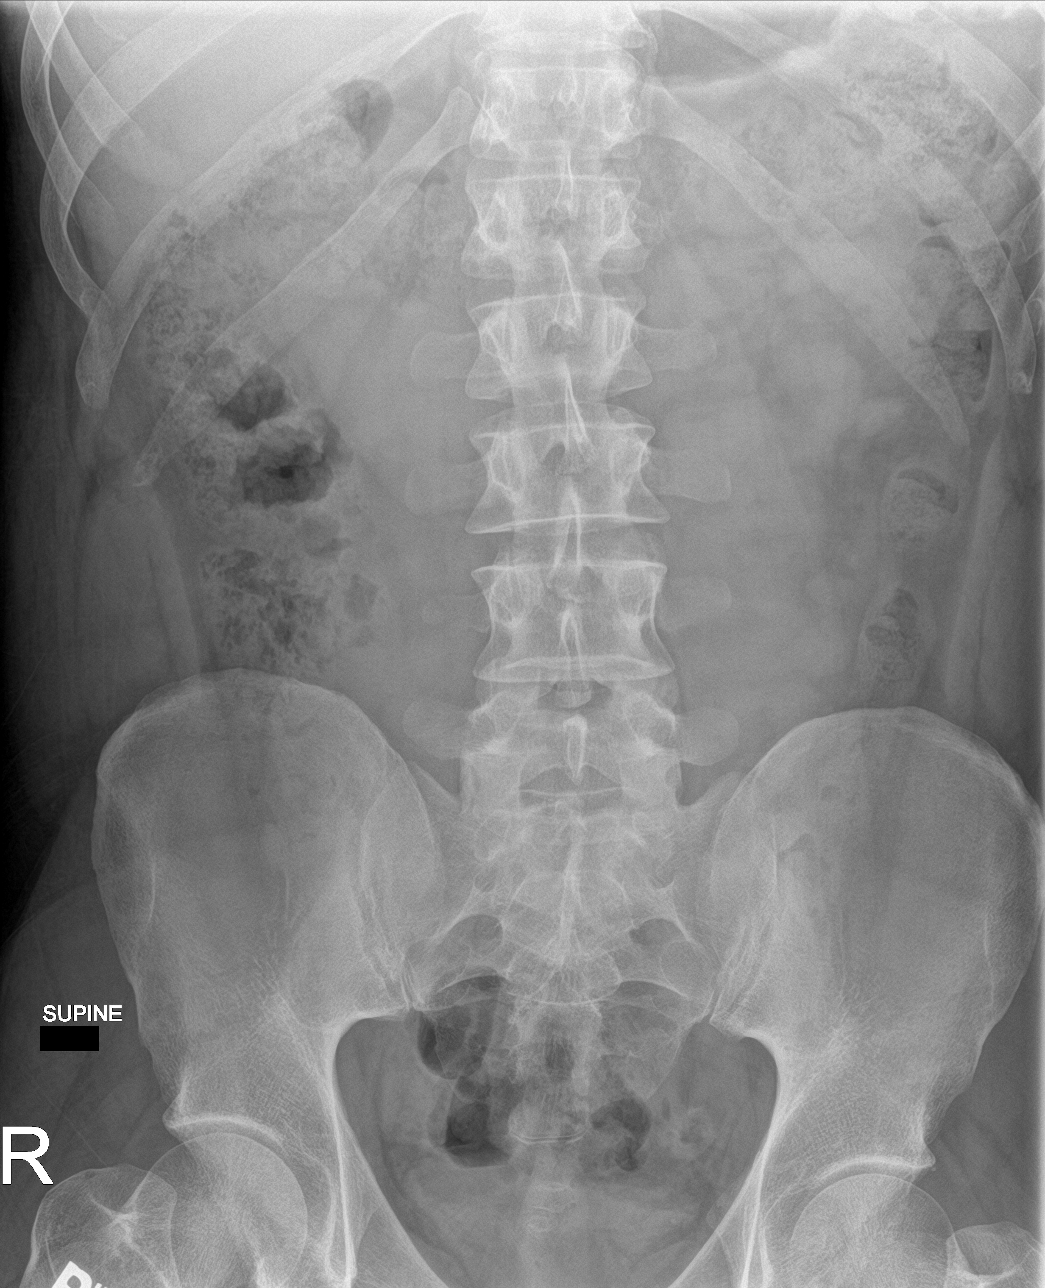
[im 4/4]
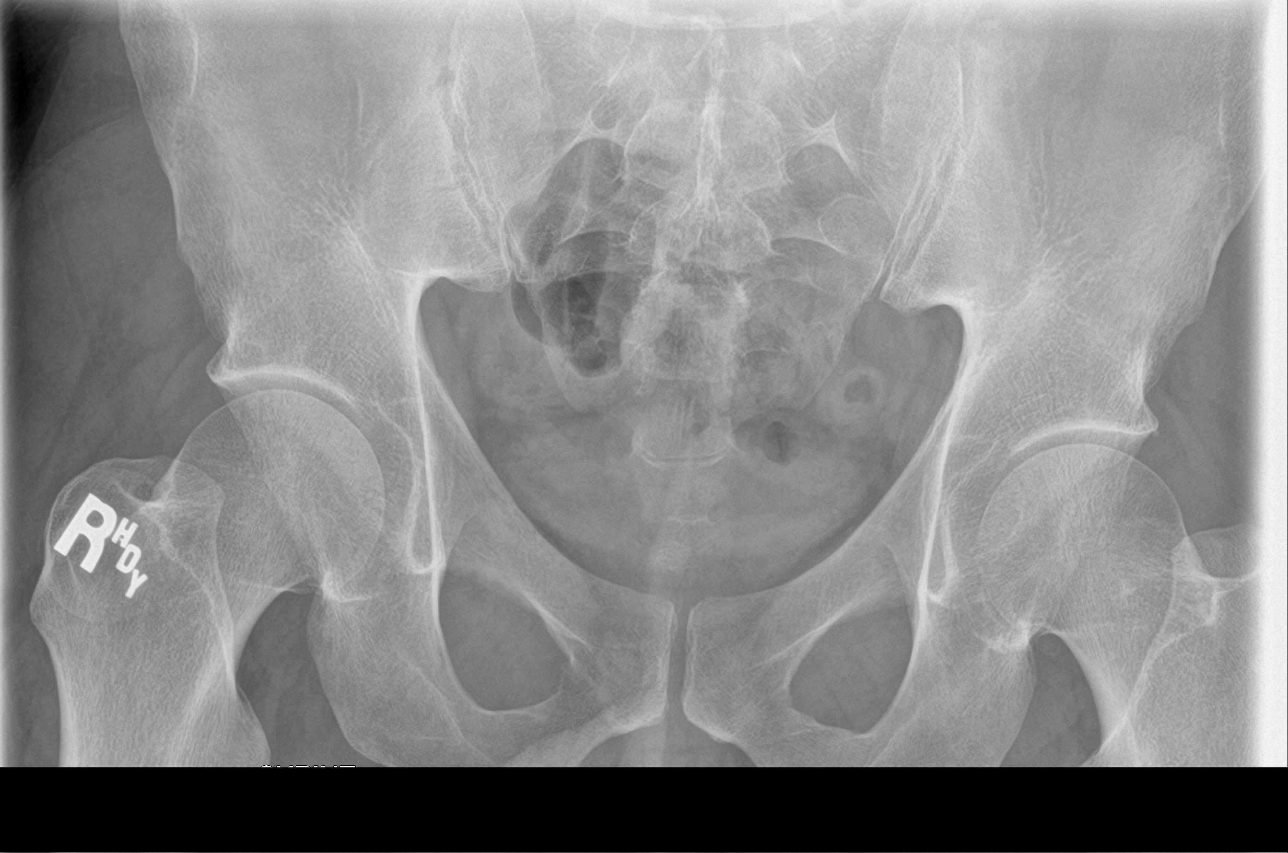

[4 of 4 positions shown; findings below may reference images not displayed]

FINDINGS: Lungs are clear.  No pleural effusion or pneumothorax.

The heart is normal in size.

Nonobstructive bowel gas pattern.

No evidence of free air under the diaphragm on the upright view.

Visualized osseous structures are within normal limits.
IMPRESSION: No evidence of acute cardiopulmonary disease.

No evidence of small bowel obstruction or free air.
